# Patient Record
Sex: Male | Born: 1955 | Race: Black or African American | Hispanic: No | Marital: Married | Smoking: Current every day smoker
Health system: Southern US, Community
[De-identification: ages and names within clinical notes are randomized; demographics above are authoritative.]

## PROBLEM LIST (undated history)

## (undated) DIAGNOSIS — I10 Essential (primary) hypertension: Secondary | ICD-10-CM

## (undated) DIAGNOSIS — M542 Cervicalgia: Secondary | ICD-10-CM

## (undated) DIAGNOSIS — G8929 Other chronic pain: Secondary | ICD-10-CM

## (undated) DIAGNOSIS — E785 Hyperlipidemia, unspecified: Secondary | ICD-10-CM

## (undated) HISTORY — DX: Other chronic pain: G89.29

## (undated) HISTORY — DX: Essential (primary) hypertension: I10

## (undated) HISTORY — PX: CERVICAL SPINE SURGERY: SHX589

## (undated) HISTORY — DX: Cervicalgia: M54.2

## (undated) HISTORY — DX: Hyperlipidemia, unspecified: E78.5

---

## 2005-05-02 ENCOUNTER — Ambulatory Visit (HOSPITAL_COMMUNITY): Admission: RE | Admit: 2005-05-02 | Discharge: 2005-05-02 | Payer: Self-pay | Admitting: Neurological Surgery

## 2005-06-27 ENCOUNTER — Observation Stay (HOSPITAL_COMMUNITY): Admission: RE | Admit: 2005-06-27 | Discharge: 2005-06-28 | Payer: Self-pay | Admitting: Neurological Surgery

## 2005-08-28 ENCOUNTER — Encounter (HOSPITAL_COMMUNITY): Admission: RE | Admit: 2005-08-28 | Discharge: 2005-09-27 | Payer: Self-pay | Admitting: Neurological Surgery

## 2005-12-09 ENCOUNTER — Ambulatory Visit (HOSPITAL_COMMUNITY): Admission: RE | Admit: 2005-12-09 | Discharge: 2005-12-09 | Payer: Self-pay | Admitting: Neurological Surgery

## 2006-03-06 ENCOUNTER — Ambulatory Visit (HOSPITAL_COMMUNITY): Admission: RE | Admit: 2006-03-06 | Discharge: 2006-03-06 | Payer: Self-pay | Admitting: Neurological Surgery

## 2006-03-11 ENCOUNTER — Ambulatory Visit (HOSPITAL_COMMUNITY): Admission: RE | Admit: 2006-03-11 | Discharge: 2006-03-11 | Payer: Self-pay | Admitting: Neurological Surgery

## 2008-02-13 IMAGING — CT CT CERVICAL SPINE W/ CM
4 of 5 series · 15 of 33 positions shown, 18 images · IV contrast (omnipaque)
Comparison: none

CLINICAL DATA: Surgery of the cervical spine in June 2005.  Now with right arm pain and left leg pain.  
CERVICAL MYELOGRAM:
TECHNIQUE: The procedure and associated risks were reviewed with the patient.  Written as well as oral consent was obtained.  Under fluoroscopic guidance and aseptic technique, an L3-4 lumbar puncture was performed with a single as with a 22 gauge spinal needle.  Clear cerebrospinal fluid was noted.  8 cc of Omnipaque 300 was instilled.  Cervical myelogram was performed without difficulty.
TECHNIQUE: Multidetector CT imaging of the cervical spine was performed after intrathecal injection of contrast.  Multiplanar CT image reconstructions were also generated.   
The present examination had to be performed with the patient in a right side down position as the patient could not lie flat on his back.  This is felt to be a diagnostic study.

[Series 4: 2mm axial soft tissue · axial · 0.25mm/px · z∈[-254,-98]mm · 6 of 110 slices shown, 8 images]
[im 16/110  soft-tissue]
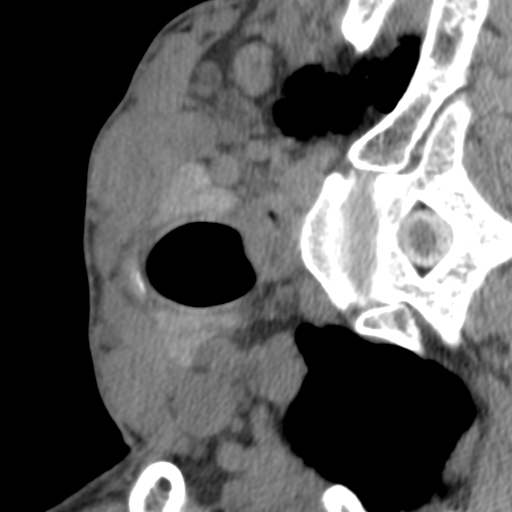
[im 16/110  bone]
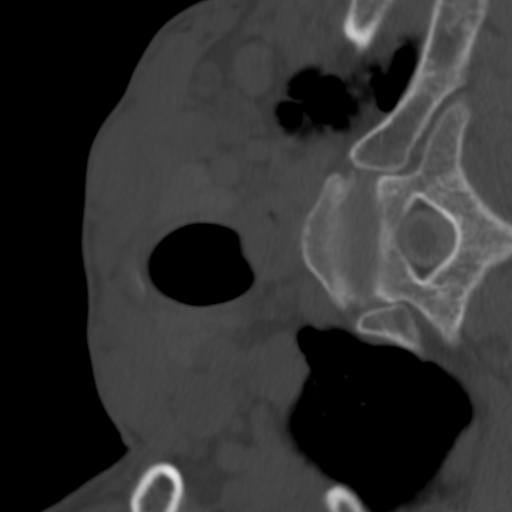
[im 32/110  bone]
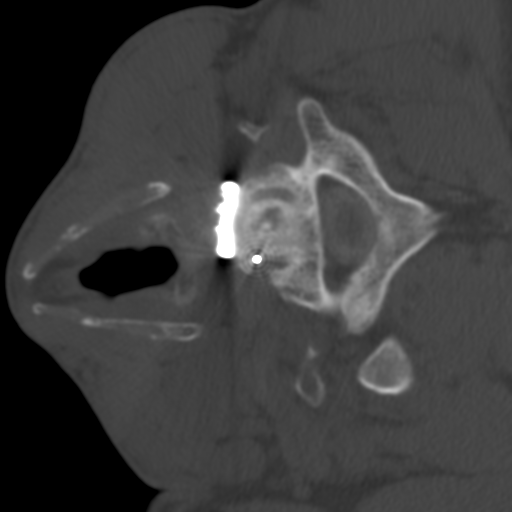
[im 47/110  bone]
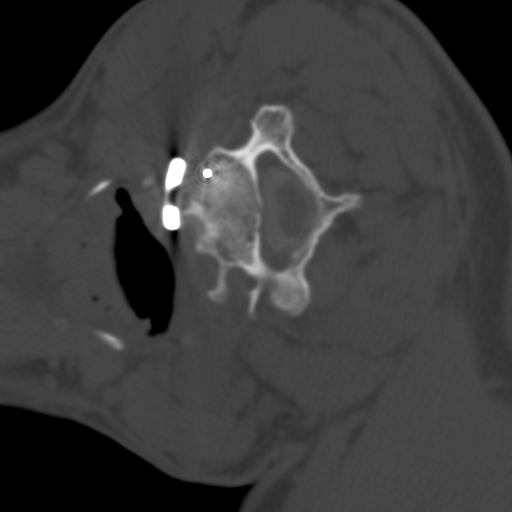
[im 63/110  bone]
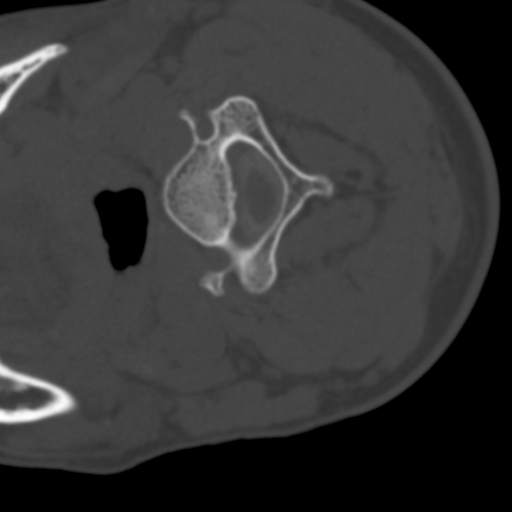
[im 78/110  soft-tissue]
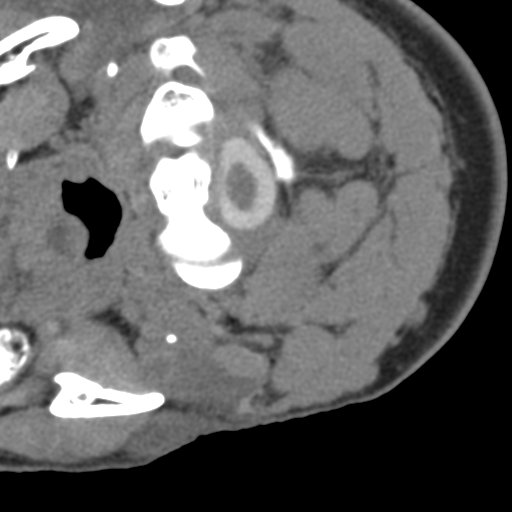
[im 78/110  bone]
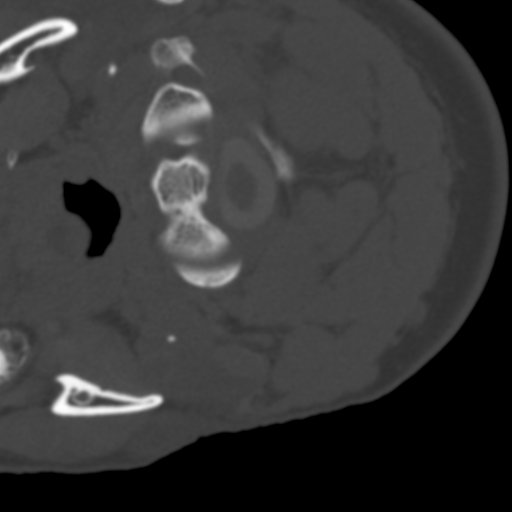
[im 94/110  bone]
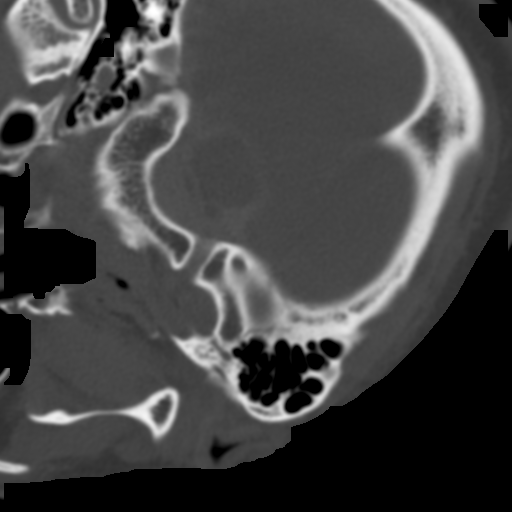

[Series 604: cor bone · coronal · 0.43mm/px · 1 of 52 slices shown]
[im 26/52  bone]
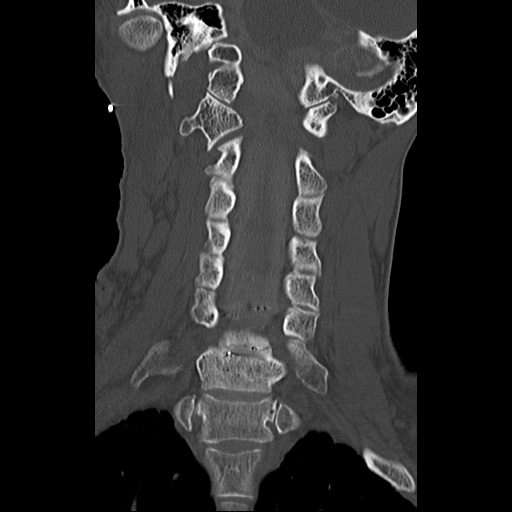

[Series 605: sag standard · sagittal · 0.43mm/px · 5 of 56 slices shown, 6 images]
[im 19/56  bone]
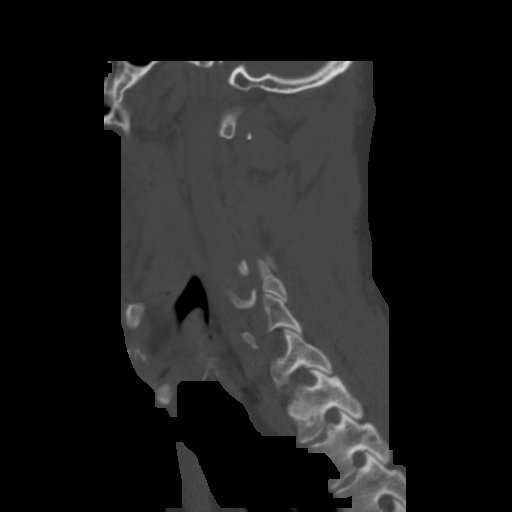
[im 23/56  bone]
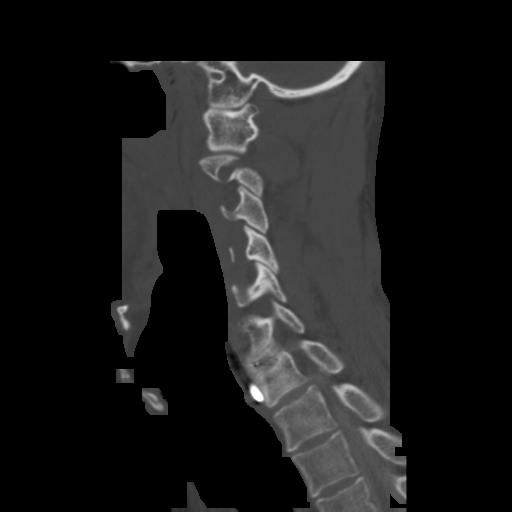
[im 28/56  soft-tissue]
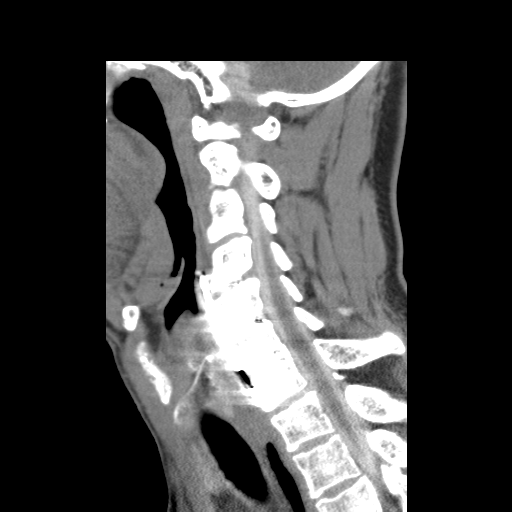
[im 28/56  bone]
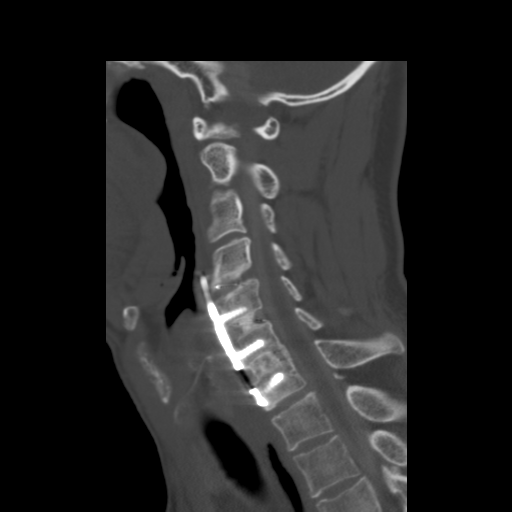
[im 33/56  bone]
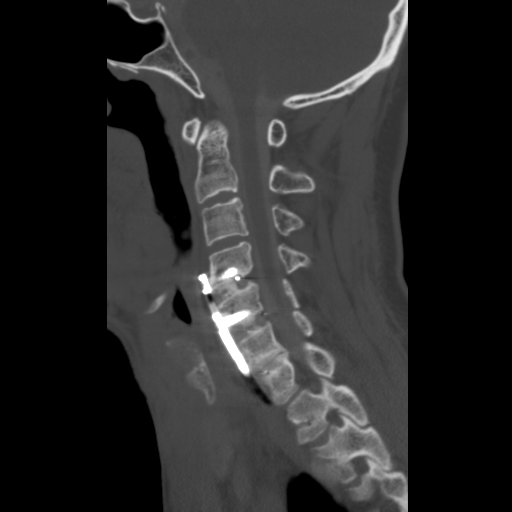
[im 37/56  bone]
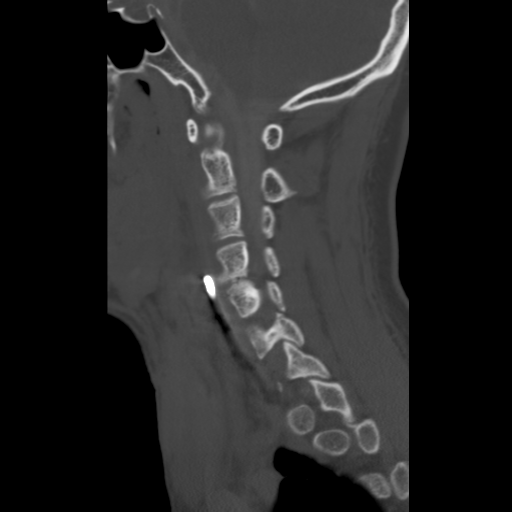

[Series 607: standard recon axials · axial · 0.43mm/px · z∈[-239,-180]mm · 3 of 77 slices shown]
[im 16/77  soft-tissue]
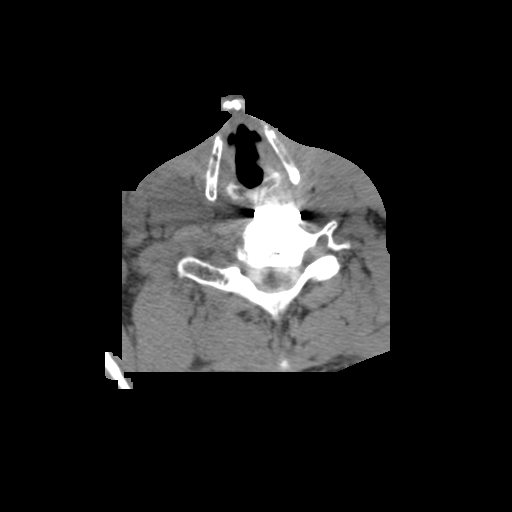
[im 31/77  soft-tissue]
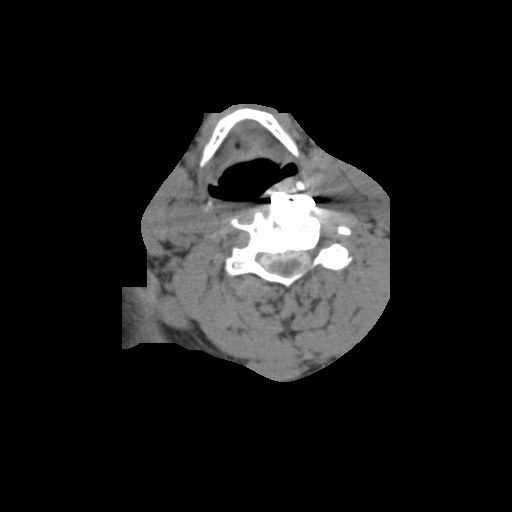
[im 46/77  soft-tissue]
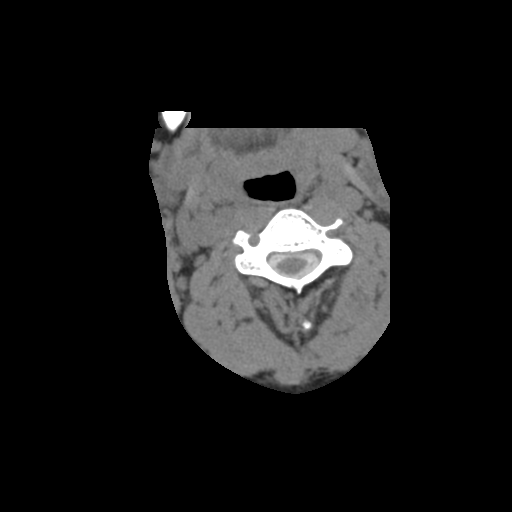

[15 of 33 positions shown; findings below may reference images not displayed]

FINDINGS: Status post fusion from C4 to C7.  With the patient?s neck in an extended position, there is circumferential narrowing of the canal most notable at the C3-4 level followed by the C2-3 level.
IMPRESSION: 1.  Status post fusion from C4 to C7. 
2.    Circumferential narrowing of the thecal sac at C3-4 followed by C2-3 level.
CT CERVICAL SPINE WITH CONTRAST (POST-MYELOGRAM):
FINDINGS: The cervicomedullary junction appears unremarkable.  Upon review the patient?s notes, it appears there was a prior MRI scan performed at an outside institution on 03/18/05 at which time a syrinx at C5-6 level inferiorly was noted.  This finding is not appreciated or evaluated on the present examination.  
Prior fusion from the C4 to C7 level.  With the patient?s head rotated slightly toward the right, there is impression upon the left posterior aspect of the pharynx at the piriform sinus level.  Significance is indeterminate.  iapical parenchymal changes without associated bony destruction which may represent scarring. Stability will need to be confirmed on follow-up.
C2-3:  No significant spinal stenosis or foraminal narrowing.
C3-4:  Mild bulge and mild spinal stenosis.
C4-5:  Minimal spur.  No significant spinal stenosis or foraminal narrowing.
C5-6:  No significant spinal stenosis or foraminal narrowing.
C6-7:  Small broad based bony spur with mild spinal stenosis.  Left uncinate hypertrophy with minimal left foraminal narrowing.
C7-T1:  Mild bulge.  Mild bilateral facet joint degenerative changes.  Minimal anterior slip of C7 upon T1.  No significant spinal stenosis.
IMPRESSION: 1.  Status post fusion C4 to C7 with small spur at the C6-7 level without significant spinal stenosis or foraminal narrowing.
2.  Congenitally narrowed spinal canal.  Addition of mild bulge at the C3-4 level causes mild spinal stenosis.  
3.  Biapical pleural thickening.  Recommend confirming stability on follow-up.
4.  Slight indentation upon the left posterior piriform sinus region with the patient?s head towards the right.  This is of questionable significance.

## 2010-01-28 ENCOUNTER — Encounter: Payer: Self-pay | Admitting: Neurological Surgery

## 2011-02-19 ENCOUNTER — Ambulatory Visit
Admission: RE | Admit: 2011-02-19 | Discharge: 2011-02-19 | Disposition: A | Discharge status: Home / Self Care | Payer: Self-pay | Source: Ambulatory Visit | Attending: Neurological Surgery | Admitting: Neurological Surgery

## 2011-02-19 ENCOUNTER — Other Ambulatory Visit: Payer: Self-pay | Admitting: Neurological Surgery

## 2011-02-19 DIAGNOSIS — M47812 Spondylosis without myelopathy or radiculopathy, cervical region: Secondary | ICD-10-CM

## 2017-03-11 ENCOUNTER — Encounter: Payer: Self-pay | Admitting: Physical Therapy

## 2017-03-11 ENCOUNTER — Ambulatory Visit: Payer: BLUE CROSS/BLUE SHIELD | Attending: Adult Health Nurse Practitioner | Admitting: Physical Therapy

## 2017-03-11 ENCOUNTER — Other Ambulatory Visit: Payer: Self-pay

## 2017-03-11 DIAGNOSIS — M5412 Radiculopathy, cervical region: Secondary | ICD-10-CM | POA: Insufficient documentation

## 2017-03-11 NOTE — Patient Instructions (Signed)
Flexibility: Neck Retraction    Pull head straight back, keeping eyes and jaw level. Repeat _5-10___ times per set. Do _1-2___ sets per session. Do __3__ sessions per day.  http://orth.exer.us/344   Copyright  VHI. All rights reserved.   [facility]  DRY NEEDLING CONSENT FORM   Trigger point dry needling is a physical therapy approach to treat Myofascial Pain and Dysfunction.  Dry Needling (DN) is a valuable and effective way to deactivate myofascial trigger points (muscle knots). It is skilled intervention that uses a thin filiform needle to penetrate the skin and stimulate underlying myofascial trigger points, muscular, and connective tissues for the management of neuromusculoskeletal pain and movement impairments.  A local twitch response (LTR) will be elicited.  This can sometimes feel like a deep ache in the muscle during the procedure. Multiple trigger points in multiple muscles can be treated during each treatment.  No medication of any kind is injected.   As with any medical treatment and procedure, there are possible adverse events.  While significant adverse events are uncommon, they do sometimes occur and must be considered prior to giving consent.  1. Dry needling often causes a "post needling soreness".  There can be an increase in pain from a couple of hours to 2-3 days, followed by an improvement in the overall pain state. 2. Any time a needle is used there is a risk of infection.  However, we are using new, sterile, and disposable needles; infections are extremely rare. 3. There is a possibility that you may bleed or bruise.  You may feel tired and some nausea following treatment. 4. There is a rare possibility of a pneumothorax (air in the chest cavity). 5. Allergic reaction to nickel in the stainless steel needle. 6. If a nerve is touched, it may cause paresthesia (a prickling/shock sensation) which is usually brief, but may continue for a  couple of days.  Following treatment stay hydrated.  Continue regular activities but not too vigorous initially after treatment for 24-48 hours. You may apply heat to sore muscles.  Dry Needling is best when combined with other physical therapy interventions such as strengthening, stretching and other therapeutic modalities.     PLEASE ANSWER THE FOLLOWING QUESTIONS:  Do you have a lack of sensation?   Y/N  Do you have a phobia or fear of needles  Y/N  Are you pregnant?    Y/N If yes:  How many weeks? _____  Do you have any implanted devices?  Y/N If yes:  Pacemaker/Spinal Cord         Stimulator/Deep Brain         Stimulator/Insulin          Pump/Other: ____________ Do you have any implants?   Y/N If yes:      Do you take any blood thinners?   Y/N If yes: Coumadin          (Warfarin)/Other:  Do you have a bleeding disorder?   Y/N If yes: What kind:   Do you take any immunosuppressants?  Y/N If yes:   What kind:   Do you take anti-inflammatories?   Y/N If yes: What kind:  Have you ever been diagnosed with Scoliosis? Y/N  Have you had back surgery?    Y/N If yes:         Laminectomy/Fusion/Other:   I have read, or had read to me, the above.  I have had the opportunity to ask any questions.  All of  my questions have been answered to my satisfaction and I understand the risks involved with dry needling.  [facility] and [facility], including dry needling, of any and all of my involved and affected muscles.   Solon PalmJulie Nathyn Luiz, PT 03/11/17 9:02 AM [facility] [address] [place], [place], [address] Phone: [phone]   Fax:  [phone]
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
## 2017-03-18 ENCOUNTER — Ambulatory Visit: Payer: BLUE CROSS/BLUE SHIELD | Admitting: Physical Therapy

## 2017-03-18 DIAGNOSIS — M5412 Radiculopathy, cervical region: Secondary | ICD-10-CM

## 2017-03-18 NOTE — Therapy (Signed)
[facility] [place], [place], [address] Phone: [phone]   Fax:  [phone]  Physical Therapy Treatment  Patient Details  Name: Jermaine Spencer MRN: [id number] Date of Birth: [birth year] Referring Provider: Lars Mage NP   Encounter Date: 03/18/2017  PT End of Session - 03/18/17 0810    Visit Number  2    Number of Visits  12    Date for PT Re-Evaluation  04/22/17    PT Start Time  0815    PT Stop Time  0913    PT Time Calculation (min)  58 min    Activity Tolerance  Patient limited by pain    Behavior During Therapy  [facility] for tasks assessed/performed       Past Medical History:  Diagnosis Date  . Hypertension     No past surgical history on file.  There were no vitals filed for this visit.  Subjective Assessment - 03/18/17 0817    Subjective  Patient reports no improvements with chin tucks and extension and says it may make it worse. He was only able to walk a half a mile the other day and the his neck and arm were just throbbing. Patient said the meloxicam initially helped but now he gets no relief from it.     Patient Stated Goals  get back to PLOF    Currently in Pain?  Yes    Pain Score  8  8.5    Pain Location  Neck    Pain Orientation  Mid    Pain Descriptors / Indicators  Sharp;Throbbing    Pain Type  Chronic pain                      [facility] Adult PT Treatment/Exercise - 03/18/17 0001      Neck Exercises: Seated   Neck Retraction  5 reps;10 secs      Neck Exercises: Supine   Cervical Isometrics  Extension;Flexion;Right lateral flexion;Left lateral flexion;5 secs;5 reps    Neck Retraction  5 reps;5 secs;Limitations      Modalities   Modalities  Cryotherapy;Moist Heat      Moist Heat Therapy   Number Minutes Moist Heat  15 Minutes    Moist Heat Location  Cervical      Electrical Stimulation   Electrical Stimulation Location  L neck and UT premod 80-150 Hz x 15 min in  sitting    Electrical Stimulation Goals  Pain      Manual Therapy   Manual Therapy  Manual Traction;Myofascial release    Myofascial Release  OA release    Manual Traction  2 bouts x 30 sec             PT Education - 03/18/17 1521    Education provided  Yes    Education Details  HEP    Person(s) Educated  Patient    Methods  Explanation;Demonstration;Verbal cues;Handout    Comprehension  Verbalized understanding;Returned demonstration          PT Long Term Goals - 03/11/17 1103      PT LONG TERM GOAL #1   Title  I with HEP    Time  6    Period  Weeks    Status  New    Target Date  04/22/17      PT LONG TERM GOAL #2   Title  Patient to report no numbness into LUE with ADLS    Time  6    Period  Weeks    Status  New    Target Date  04/22/17      PT LONG TERM GOAL #3   Title  Patient to demonstrate functional ROM in the neck to perform normal ADLs with pain 3/10 or less.    Time  6    Period  Weeks    Status  New    Target Date  04/22/17      PT LONG TERM GOAL #4   Title  Patient to demo improved grip strength to 40# or more bil.    Time  6    Period  Weeks    Status  New    Target Date  04/22/17            Plan - 03/18/17 1523    Clinical Impression Statement  Patient reports no improvements today. He felt cervical retraction with ext made sx worse so we stuck with just retraction. This also felt better in supine vs. sitting. No significant relief with manual traction today. He did well with isometrics. All exercises are performed very slowly to avoid pain. No goals met as only second visit.    Rehab Potential  Good    PT Frequency  2x / week    PT Duration  6 weeks    PT Treatment/Interventions  ADLs/Self Care Home Management;Cryotherapy;Electrical Stimulation;Moist Heat;Ultrasound;Therapeutic exercise;Manual techniques;Patient/family education;Neuromuscular re-education;Dry needling    PT Next Visit Plan  Continue with isometrics in supine; work  grip strength; Assess ULTT as tolerated;  DN to suboccipital/cspine L, gentle manual traction; manual STW,  estim for pain.    PT Home Exercise Plan  chin tuck supine, isometrics supine       Patient will benefit from skilled therapeutic intervention in order to improve the following deficits and impairments:  Pain, Decreased range of motion, Decreased strength, Impaired UE functional use  Visit Diagnosis: Radiculopathy, cervical region     Problem List There are no active problems to display for this patient.   Almyra Free Mehreen Azizi PT 03/18/2017, 3:29 PM  [facility] [place], [place], [address] Phone: [phone]   Fax:  [phone]  Name: Jermaine Spencer MRN: [id number] Date of Birth: 18-Jul-1955

## 2017-03-18 NOTE — Patient Instructions (Signed)
   Solon PalmJulie Ilija Maxim, PT 03/18/17 8:52 AM [facility] [address] [place], [place], [address] Phone: [phone]   Fax:  [phone]

## 2017-03-25 ENCOUNTER — Ambulatory Visit: Payer: BLUE CROSS/BLUE SHIELD | Admitting: Physical Therapy

## 2017-03-25 DIAGNOSIS — M5412 Radiculopathy, cervical region: Secondary | ICD-10-CM

## 2017-03-25 NOTE — Therapy (Signed)
[facility] [address] [place], [place], [address] Phone: [phone]   Fax:  [phone]  Physical Therapy Treatment  Patient Details  Name: Jermaine Spencer MRN: [id number] Date of Birth: [birth year] Referring Provider: Sharon Seller NP   Encounter Date: 03/25/2017  PT End of Session - 03/25/17 0816    Visit Number  3    Number of Visits  12    Date for PT Re-Evaluation  04/22/17    PT Start Time  0815    PT Stop Time  0909    PT Time Calculation (min)  54 min    Activity Tolerance  Patient tolerated treatment well;Patient limited by pain    Behavior During Therapy  [facility] for tasks assessed/performed       Past Medical History:  Diagnosis Date  . Hypertension     No past surgical history on file.  There were no vitals filed for this visit.  Subjective Assessment - 03/25/17 0816    Subjective  Patient states his neck isn't hurting as bad since doing the exercises but continues to have numbness and tingling in LUE and also he gets pain when riding in a car with any jolt.    Pertinent History  HTN, cervical fusion C4-7    Diagnostic tests  xray - negative    Currently in Pain?  Yes    Pain Score  6     Pain Location  Neck    Pain Orientation  Mid    Pain Descriptors / Indicators  Sharp;Throbbing    Pain Type  Chronic pain    Pain Radiating Towards  into LUE all fingers    Pain Onset  More than a month ago    Pain Frequency  Constant    Aggravating Factors   looking down    Pain Relieving Factors  moist heat                      [facility] Adult PT Treatment/Exercise - 03/25/17 0001      Neck Exercises: Seated   Shoulder ABduction  Both;20 reps horizontal ABD red band    Other Seated Exercise  nerve glide x 10 increased pain in neck; arm still numb      Neck Exercises: Supine   Neck Retraction  5 reps;5 secs;Limitations    Other Supine Exercise  nerve glide with wrist flex/ext x 10      Modalities    Modalities  Electrical Stimulation;Moist Heat;Ultrasound      Moist Heat Therapy   Number Minutes Moist Heat  15 Minutes    Moist Heat Location  Cervical      Electrical Stimulation   Electrical Stimulation Location  L neck and UT premod 80-150 Hz x 15 min in sitting    Electrical Stimulation Goals  Pain      Ultrasound   Ultrasound Location  L UT and cspine    Ultrasound Parameters  1.5 w/cm2 1 mhz cont x 10 min    Ultrasound Goals  Pain                  PT Long Term Goals - 03/25/17 0905      PT LONG TERM GOAL #1   Title  I with HEP    Time  6    Period  Weeks    Status  On-going      PT LONG TERM GOAL #2   Title  Patient to report  no numbness into LUE with ADLS    Time  6    Period  Weeks    Status  On-going      PT LONG TERM GOAL #3   Title  Patient to demonstrate functional ROM in the neck to perform normal ADLs with pain 3/10 or less.    Time  6    Period  Weeks    Status  On-going      PT LONG TERM GOAL #4   Title  Patient to demo improved grip strength to 40# or more bil.    Time  6    Period  Weeks    Status  On-going            Plan - 03/25/17 [id number]    Clinical Impression Statement  Patient tolerated PT fairly well today. Nerve glides continue to aggravate neck. He couldn't tolerate manual therapy in L UT today so tried US to calm muscles    Rehab Potential  Good    PT Frequency  2x / week    PT Duration  6 weeks    PT Treatment/Interventions  ADLs/Self Care Home Management;Cryotherapy;Electrical Stimulation;Moist Heat;Ultrasound;Therapeutic exercise;Manual techniques;Patient/family education;Neuromuscular re-education;Dry needling    PT Next Visit Plan  Continue with isometrics in supine; work grip strength; ULTT as tolerated;  DN to suboccipital/cspine L, gentle manual traction; manual STW,  estim for pain.    PT Home Exercise Plan  chin tuck supine, isometrics supine       Patient will benefit from skilled therapeutic intervention  in order to improve the following deficits and impairments:  Pain, Decreased range of motion, Decreased strength, Impaired UE functional use  Visit Diagnosis: Radiculopathy, cervical region     Problem List There are no active problems to display for this patient.   Raynelle FanningJulie Maryclare Nydam PT 03/25/2017, 9:08 AM  [facility] [address] [place], [place], [address] Phone: [phone]   Fax:  [phone]  Name: Jermaine Spencer MRN: [id number] Date of Birth: May 15, 1955

## 2017-04-01 ENCOUNTER — Ambulatory Visit: Payer: BLUE CROSS/BLUE SHIELD | Admitting: Physical Therapy

## 2017-04-01 DIAGNOSIS — M5412 Radiculopathy, cervical region: Secondary | ICD-10-CM | POA: Diagnosis not present

## 2017-04-01 NOTE — Therapy (Signed)
[facility] [place], [place], [address] Phone: [phone]   Fax:  [phone]  Physical Therapy Treatment  Patient Details  Name: Jermaine Spencer MRN: [id number] Date of Birth: Jun 15, 1955 Referring Provider: Lars Mage NP   Encounter Date: 04/01/2017  PT End of Session - 04/01/17 0815    Visit Number  4    Number of Visits  12    Date for PT Re-Evaluation  04/22/17    Authorization Type  FOTO every 5th visit    PT Start Time  0815    PT Stop Time  0911    PT Time Calculation (min)  56 min    Activity Tolerance  Patient limited by pain    Behavior During Therapy  [facility] for tasks assessed/performed       Past Medical History:  Diagnosis Date  . Hypertension     No past surgical history on file.  There were no vitals filed for this visit.  Subjective Assessment - 04/01/17 0815    Subjective  Patient was awakened Saturday night and sat up up without supporting his neck. He heard a "pop" and had immediate pain into the LUE an LLE. Since that time he has had intermittent numbness into LLE. Pt also reports tingling into LUE with temperature changes specifically into the L middle finger. He is compliant with HEP.    Pertinent History  HTN, cervical fusion C4-7    Patient Stated Goals  get back to PLOF    Currently in Pain?  Yes    Pain Score  7     Pain Location  Neck    Pain Orientation  Mid    Pain Descriptors / Indicators  Sharp;Throbbing    Pain Type  Chronic pain    Pain Onset  More than a month ago    Pain Frequency  Constant    Aggravating Factors   looking down    Pain Relieving Factors  moist heat, isometrics         [facility] PT Assessment - 04/01/17 0001      AROM   Overall AROM Comments  Grip R 65#, L 25#    AROM Assessment Site  Cervical    Cervical - Right Side Bend  9    Cervical - Left Side Bend  15    Cervical - Right Rotation  42    Cervical - Left Rotation  24            No  data recorded       [facility] Adult PT Treatment/Exercise - 04/01/17 0001      Self-Care   Self-Care  Other Self-Care Comments    Other Self-Care Comments   Discussed current symptoms and POC with patient       Neck Exercises: Seated   Other Seated Exercise  squeezing red putty x 5 min       Neck Exercises: Supine   Neck Retraction  5 reps;5 secs;Limitations    Other Supine Exercise  decompression exerrcises head/shoulder/legs 5 sec x 5 each to tolerance      Modalities   Modalities  Electrical Stimulation;Moist Heat      Moist Heat Therapy   Number Minutes Moist Heat  15 Minutes    Moist Heat Location  Cervical      Electrical Stimulation   Electrical Stimulation Location  L neck and UT premod 80-150 Hz x 15 min in sitting    Electrical Stimulation Goals  Pain             PT Education - 04/01/17 1406    Education provided  Yes    Education Details  HEP    Person(s) Educated  Patient    Methods  Explanation;Demonstration;Handout;Verbal cues    Comprehension  Verbalized understanding;Returned demonstration          PT Long Term Goals - 04/01/17 1407      PT LONG TERM GOAL #1   Title  I with HEP    Time  6    Period  Weeks    Status  Achieved      PT LONG TERM GOAL #2   Title  Patient to report no numbness into LUE with ADLS    Time  6    Period  Weeks    Status  Not Met      PT LONG TERM GOAL #3   Title  Patient to demonstrate functional ROM in the neck to perform normal ADLs with pain 3/10 or less.    Time  6    Period  Weeks    Status  Not Met      PT LONG TERM GOAL #4   Title  Patient to demo improved grip strength to 40# or more bil.    Time  6    Period  Weeks    Status  Not Met            Plan - 04/01/17 0901    Clinical Impression Statement  Patient was awakened Saturday night and sat up up without supporting his neck. He heard a "pop" and had immediate pain into the LUE an LLE. Since that time he has had intermittent numbness into  LLE. Pt also reports tingling into LUE with temperature changes specifically into the L middle finger. He is compliant with HEP and reports that supine isometrics help somewhat. Patient has had no change in radicular symptoms and they seem to be worsening. He has had no improvement in ROM and left grip strength was weaker today than at eval.  I've advised pt to f/u earlier with MD for further testing.     Rehab Potential  Good    PT Frequency  2x / week    PT Duration  6 weeks    PT Treatment/Interventions  ADLs/Self Care Home Management;Cryotherapy;Electrical Stimulation;Moist Heat;Ultrasound;Therapeutic exercise;Manual techniques;Patient/family education;Neuromuscular re-education;Dry needling    PT Next Visit Plan  Continue with isometrics in supine; work grip strength; ULTT as tolerated;  DN to suboccipital/cspine L, gentle manual traction; manual STW,  estim for pain.    PT Home Exercise Plan  chin tuck supine, isometrics supine, decompression exercises for OP    Consulted and Agree with Plan of Care  Patient       Patient will benefit from skilled therapeutic intervention in order to improve the following deficits and impairments:  Pain, Decreased range of motion, Decreased strength, Impaired UE functional use  Visit Diagnosis: Radiculopathy, cervical region     Problem List There are no active problems to display for this patient.   Madelyn Flavors PT 04/01/2017, 2:14 PM  [facility] [address] [place], [place], [address] Phone: [phone]   Fax:  [phone]  Name: Jermaine Spencer MRN: [id number] Date of Birth: 01/30/1955

## 2017-04-01 NOTE — Patient Instructions (Addendum)
RE-ALIGNMENT ROUTINE EXERCISES-OSTEOPROROSIS BASIC FOR POSTURAL CORRECTION   RE-ALIGNMENT Tips BENEFITS: 1.It helps to re-align the curves of the back and improve standing posture. 2.It allows the back muscles to rest and strengthen in preparation for more activity. FREQUENCY: Daily, even after weeks, months and years of more advanced exercises. START: 1.All exercises start in the same position: lying on the back, arms resting on the supporting surface, palms up and slightly away from the body, backs of hands down, knees bent, feet flat. 2.The head, neck, arms, and legs are supported according to specific instructions of your therapist. Copyright  V[employer]. All rights reserved.    1. Decompression Exercise: Basic.   Takes compression off the vertebral bodies; increases tolerance for lying on the back; helps relieve back pain   Lie on back on firm surface, knees bent, feet flat, arms turned up, out to sides (~35 degrees). Head neck and arms supported as necessary. Time _5-15__ minutes. Surface: floor     2. Shoulder Press  Strengthens upper back extensors and scapular retractors.   Press both shoulders down. Hold _2-3__ seconds. Repeat _3-5__ times. Surface: floor        3. Head Press With Chin Tuck  Strengthens neck extensors   Tuck chin SLIGHTLY toward chest, keep mouth closed. Feel weight on back of head. Increase weight by pressing head down. Hold _2-3__ seconds. Relax. Repeat 3-5___ times. Surface: floor     4. Leg Lengthener: stretches quadratus lumborum and hip flexors.  Strengthens quads and ankle dorsiflexors.  Leg Lengthener: Full    Straighten one leg. Pull toes AND forefoot toward knee, extend heel. Lengthen leg by pulling pelvis away from ribs. Hold __5_ seconds. Relax. Repeat 1 time. Re-bend knee. Do other leg. Each leg __5_ times. Surface: floor    Leg Lengthener / Leg Press Combo: Single Leg    Straighten one leg down to floor. Pull toes AND forefoot  toward knee; extend heel. Lengthen leg by pulling pelvis away from ribs. Press leg down. DO NOT BEND KNEE. Hold _5__ seconds. Relax leg. Repeat exercise 1 time. Relax leg. Re-bend knee. Repeat with other leg. Do 5 times  Solon PalmJulie Burel Kahre, PT 04/01/17 8:57 AM [facility] [address] [place], [place], [id number] Phone: [phone]   Fax:  [phone]

## 2017-04-08 ENCOUNTER — Ambulatory Visit: Payer: BLUE CROSS/BLUE SHIELD | Attending: Adult Health Nurse Practitioner | Admitting: Physical Therapy

## 2017-04-08 DIAGNOSIS — M5412 Radiculopathy, cervical region: Secondary | ICD-10-CM | POA: Insufficient documentation

## 2017-04-08 NOTE — Therapy (Signed)
[facility] [place], [place], [address] Phone: [phone]   Fax:  [phone]  Physical Therapy Treatment  Patient Details  Name: Jermaine Spencer MRN: [id number] Date of Birth: 30-Jan-1955 Referring Provider: Lars Mage NP   Encounter Date: 04/08/2017  PT End of Session - 04/08/17 5681    Visit Number  5    Number of Visits  12    Date for PT Re-Evaluation  04/22/17    Authorization Type  FOTO every 5th visit    PT Start Time  0815    PT Stop Time  0904    PT Time Calculation (min)  49 min    Activity Tolerance  Patient limited by pain    Behavior During Therapy  [facility] for tasks assessed/performed       Past Medical History:  Diagnosis Date  . Hypertension     No past surgical history on file.  There were no vitals filed for this visit.  Subjective Assessment - 04/08/17 0823    Subjective  Patient stated he is feeling "alright". Patient stated he was unable to make an earlier appointment for a follow up with his MD but he will stop at doctor's office tomorrow to try again. Patient stated he may need to go back to work secondary to financial needs.     Pertinent History  HTN, cervical fusion C4-7    Diagnostic tests  xray - negative    Patient Stated Goals  get back to PLOF    Currently in Pain?  Yes    Pain Score  7     Pain Location  Neck    Pain Orientation  Left;Mid    Pain Descriptors / Indicators  Sharp    Pain Type  Chronic pain    Pain Onset  More than a month ago         [facility] PT Assessment - 04/08/17 0001      Assessment   Medical Diagnosis  Cervical DDD    Next MD Visit  April      Precautions   Precautions  Cervical    Precaution Comments  previous fusion C4-7      Observation/Other Assessments   Focus on Therapeutic Outcomes (FOTO)   55% limited                   [facility] Adult PT Treatment/Exercise - 04/08/17 0001      Neck Exercises: Supine   Neck Retraction  5  reps;5 secs    Cervical Rotation  Other (comment) attempted; increase of pain    Other Supine Exercise  decompression exerrcises head/shoulder/legs 5 sec x 5 each to tolerance      Modalities   Modalities  Electrical Stimulation;Moist Heat      Moist Heat Therapy   Number Minutes Moist Heat  15 Minutes    Moist Heat Location  Cervical      Electrical Stimulation   Electrical Stimulation Location  L neck and UT premod 80-150 Hz x 15 min in sitting    Electrical Stimulation Goals  Pain      Manual Therapy   Manual Therapy  Manual Traction;Myofascial release;Soft tissue mobilization    Soft tissue mobilization  STW/M to suboccipitals, cervical paraspinals, and L UT to decrease pain; attempted UT stretch however noted with pain and increased UE sx    Myofascial Release  suboccipital release    Manual Traction  4x 15 seconds  PT Long Term Goals - 04/01/17 1407      PT LONG TERM GOAL #1   Title  I with HEP    Time  6    Period  Weeks    Status  Achieved      PT LONG TERM GOAL #2   Title  Patient to report no numbness into LUE with ADLS    Time  6    Period  Weeks    Status  Not Met      PT LONG TERM GOAL #3   Title  Patient to demonstrate functional ROM in the neck to perform normal ADLs with pain 3/10 or less.    Time  6    Period  Weeks    Status  Not Met      PT LONG TERM GOAL #4   Title  Patient to demo improved grip strength to 40# or more bil.    Time  6    Period  Weeks    Status  Not Met            Plan - 04/08/17 0914    Clinical Impression Statement  Patient was able to complete ther-ex but reported pain & neurological sx with supine c/s rotation. Patient reported increased tenderness along superior angle of the L scapula, L cervical paraspinals, L scalenes however patient stated STW was tolerable after pressure was decreased. Patient was unable to tolerate passive cervical rotation secondary to increased pain and UE symptoms;  c/s rotation terminated. Patient educated to continue HEP; patient reported understanding. Normal response to modalities upon removal. FOTO limitation: 55% limited.    Clinical Presentation  Evolving    Clinical Decision Making  Low    Rehab Potential  Good    PT Frequency  2x / week    PT Duration  6 weeks    PT Next Visit Plan  Continue with isometrics in supine; work grip strength; ULTT as tolerated;  DN to suboccipital/cspine L, gentle manual traction; manual STW,  estim for pain.    PT Home Exercise Plan  chin tuck supine, isometrics supine, decompression exercises for OP    Consulted and Agree with Plan of Care  Patient       Patient will benefit from skilled therapeutic intervention in order to improve the following deficits and impairments:  Pain, Decreased range of motion, Decreased strength, Impaired UE functional use  Visit Diagnosis: Radiculopathy, cervical region     Problem List There are no active problems to display for this patient.  Jermaine Spencer, PT, DPT 04/08/2017, 9:28 AM  [facility] [address] [place], [place], [address] Phone: [phone]   Fax:  [phone]  Name: Jermaine Spencer MRN: [id number] Date of Birth: 22-Feb-1955

## 2017-04-15 ENCOUNTER — Ambulatory Visit: Payer: BLUE CROSS/BLUE SHIELD | Admitting: Physical Therapy

## 2017-04-15 DIAGNOSIS — M5412 Radiculopathy, cervical region: Secondary | ICD-10-CM | POA: Diagnosis not present

## 2017-04-15 NOTE — Therapy (Signed)
[facility] [place], [place], [address] Phone: [phone]   Fax:  [phone]  Physical Therapy Evaluation  Patient Details  Name: Jermaine Spencer MRN: [id number] Date of Birth: 07-12-1955 Referring Provider: Lars Mage NP   Encounter Date: 04/15/2017  PT End of Session - 04/15/17 0811    Visit Number  6    Number of Visits  12    Date for PT Re-Evaluation  04/22/17    Authorization Type  FOTO every 5th visit    PT Start Time  0815    PT Stop Time  0909    PT Time Calculation (min)  54 min    Activity Tolerance  Patient limited by pain    Behavior During Therapy  [facility] for tasks assessed/performed       Past Medical History:  Diagnosis Date  . Hypertension     No past surgical history on file.  There were no vitals filed for this visit.   Subjective Assessment - 04/15/17 0821    Subjective  Patient reports he is scheduled for an MRI on 04/17/17.  He is back to work and states that if he lifts anything over 10# he feels it up into his neck.     Pertinent History  HTN, cervical fusion C4-7    Patient Stated Goals  get back to PLOF    Currently in Pain?  Yes    Pain Score  7     Pain Location  Neck    Pain Orientation  Mid;Left    Pain Descriptors / Indicators  Sharp    Pain Type  Chronic pain    Pain Onset  More than a month ago    Pain Frequency  Constant         [facility] PT Assessment - 04/15/17 0001      AROM   Overall AROM Comments  Grip R 85#, L 20#  (Pended)                 Objective measurements completed on examination: See above findings.      [place] Adult PT Treatment/Exercise - 04/15/17 0001      Neck Exercises: Seated   Shoulder ABduction  10 reps horizontal; increases sx    Other Seated Exercise  robber with red band x 30      Neck Exercises: Supine   Neck Retraction  5 secs;20 reps 1 set prior to band ex; 1 set prior to supine to sit    Shoulder ABduction  Left;5  reps increased neck sx    Upper Extremity D2  Extension increased sx      Modalities   Modalities  Electrical Stimulation;Moist Heat      Moist Heat Therapy   Number Minutes Moist Heat  15 Minutes    Moist Heat Location  Cervical      Electrical Stimulation   Electrical Stimulation Location  L neck and UT premod 80-150 Hz x 15 min in sitting    Electrical Stimulation Goals  Pain      Manual Therapy   Manual Therapy  Manual Traction    Manual Traction  4 x 30 seconds centralizes sx to above elbow from hand                  PT Long Term Goals - 04/01/17 1407      PT LONG TERM GOAL #1   Title  I with HEP    Time  6    Period  Weeks    Status  Achieved      PT LONG TERM GOAL #2   Title  Patient to report no numbness into LUE with ADLS    Time  6    Period  Weeks    Status  Not Met      PT LONG TERM GOAL #3   Title  Patient to demonstrate functional ROM in the neck to perform normal ADLs with pain 3/10 or less.    Time  6    Period  Weeks    Status  Not Met      PT LONG TERM GOAL #4   Title  Patient to demo improved grip strength to 40# or more bil.    Time  6    Period  Weeks    Status  Not Met             Plan - 04/15/17 1240    Clinical Impression Statement  Patient presents today with same c/o. He has returned to work which aggravates his sx with heavy lifting and looking down. He is scheduled for an MRI on 04/17/17. He did not tolerate most exercises today. He gets some centralization of sx to above the elbow with light manual traction, however decentralization occurs upon release of traction. Grip strength on the L continues to decrease while R increases.    PT Treatment/Interventions  ADLs/Self Care Home Management;Cryotherapy;Electrical Stimulation;Moist Heat;Ultrasound;Therapeutic exercise;Manual techniques;Patient/family education;Neuromuscular re-education;Dry needling    PT Next Visit Plan  Continue with isometrics in supine; work grip  strength; ULTT as tolerated;    PT Home Exercise Plan  chin tuck supine, isometrics supine, decompression exercises for OP       Patient will benefit from skilled therapeutic intervention in order to improve the following deficits and impairments:  Pain, Decreased range of motion, Decreased strength, Impaired UE functional use  Visit Diagnosis: Radiculopathy, cervical region     Problem List There are no active problems to display for this patient.   Madelyn Flavors PT 04/15/2017, 12:44 PM  [facility] [address] [place], [place], [address] Phone: [phone]   Fax:  [phone]  Name: Jermaine Spencer MRN: [id number] Date of Birth: [birth year]

## 2017-04-22 ENCOUNTER — Ambulatory Visit: Payer: BLUE CROSS/BLUE SHIELD | Admitting: Physical Therapy

## 2017-04-22 DIAGNOSIS — M5412 Radiculopathy, cervical region: Secondary | ICD-10-CM | POA: Diagnosis not present

## 2017-04-22 NOTE — Therapy (Addendum)
[facility] [place], [place], [address] Phone: [phone]   Fax:  [phone]  Physical Therapy Treatment PHYSICAL THERAPY DISCHARGE SUMMARY  Visits from Start of Care: 7  Current functional level related to goals / functional outcomes: See below   Remaining deficits: See goals   Education / Equipment: HEP  Plan: Patient agrees to discharge.  Patient goals were not met. Patient is being discharged due to lack of progress.  ?????  [name], PT, DPT'10/21/19    Patient Details  Name: Jermaine Spencer MRN: [id number] Date of Birth: 1955-04-02 Referring Provider: Lars Mage NP   Encounter Date: 04/22/2017  PT End of Session - 04/22/17 0807    Visit Number  7    Number of Visits  12    Date for PT Re-Evaluation  04/22/17    Authorization Type  FOTO every 5th visit    PT Start Time  0815    PT Stop Time  0906    PT Time Calculation (min)  51 min    Activity Tolerance  Patient limited by pain    Behavior During Therapy  [facility] for tasks assessed/performed       Past Medical History:  Diagnosis Date  . Hypertension     No past surgical history on file.  There were no vitals filed for this visit.  Subjective Assessment - 04/22/17 0816    Subjective  Patient had his MRI on 04/17/17. During the MRI his LUE went numb. Patient says estim gives him 3-4 hours of relief.    Patient Stated Goals  get back to PLOF    Currently in Pain?  Yes    Pain Score  7     Pain Location  Neck    Pain Orientation  Mid;Left    Pain Descriptors / Indicators  [facility] Adult PT Treatment/Exercise - 04/22/17 0001      Self-Care   Self-Care  Other Self-Care Comments    Other Self-Care Comments   Discussed current POC and lack of progress      Neck Exercises: Standing   Neck Retraction  10 reps;5 secs into ball    Wall Wash  attempted but sx increase into LUE    Other  Standing Exercises  rows and shoulder ext x 10 each pink XTS    Other Standing Exercises  cerv retraction with rotation 2x10 into ball      Modalities   Modalities  Electrical Stimulation;Moist Heat      Moist Heat Therapy   Number Minutes Moist Heat  15 Minutes    Moist Heat Location  Cervical      Electrical Stimulation   Electrical Stimulation Location  L neck and UT premod 80-150 Hz x 15 min in sitting    Electrical Stimulation Goals  Pain      Manual Therapy   Soft tissue mobilization  attempted STW to L UT/levator scapula but not tolerated                  PT Long Term Goals - 04/01/17 1407      PT LONG TERM GOAL #1   Title  I with HEP    Time  6    Period  Weeks    Status  Achieved      PT LONG TERM GOAL #2  Title  Patient to report no numbness into LUE with ADLS    Time  6    Period  Weeks    Status  Not Met      PT LONG TERM GOAL #3   Title  Patient to demonstrate functional ROM in the neck to perform normal ADLs with pain 3/10 or less.    Time  6    Period  Weeks    Status  Not Met      PT LONG TERM GOAL #4   Title  Patient to demo improved grip strength to 40# or more bil.    Time  6    Period  Weeks    Status  Not Met            Plan - 04/22/17 1505    Clinical Impression Statement  Patient presents today with same c/o of pain in his neck and LUE. He was able to tolerate some exercises for a short time, but others increased his sx. He could not tolerate manual therapy either. PT discussed lack of progress and advised putting pt on hold until results of MRI. Patient agreed. Patient is always willing to try exercises and is compliant with those that do not increase sx.,    PT Treatment/Interventions  ADLs/Self Care Home Management;Cryotherapy;Electrical Stimulation;Moist Heat;Ultrasound;Therapeutic exercise;Manual techniques;Patient/family education;Neuromuscular re-education;Dry needling    PT Next Visit Plan  PATIENT ON HOLD FOR 2 WEEKS  UNTIL 05/06/17. D/C at this time unless MD orders further PT.       Patient will benefit from skilled therapeutic intervention in order to improve the following deficits and impairments:  Pain, Decreased range of motion, Decreased strength, Impaired UE functional use  Visit Diagnosis: Radiculopathy, cervical region     Problem List There are no active problems to display for this patient.  Almyra Free Ward Boissonneault PT 04/22/2017, 3:09 PM  [facility] [address] [place], [place], [address] Phone: [phone]   Fax:  [phone]  Name: Jermaine Spencer MRN: [id number] Date of Birth: [birth year]

## 2017-06-06 ENCOUNTER — Encounter: Payer: Self-pay | Admitting: Family Medicine

## 2017-06-06 ENCOUNTER — Ambulatory Visit (INDEPENDENT_AMBULATORY_CARE_PROVIDER_SITE_OTHER): Payer: Worker's Compensation

## 2017-06-06 ENCOUNTER — Ambulatory Visit (INDEPENDENT_AMBULATORY_CARE_PROVIDER_SITE_OTHER): Payer: Worker's Compensation | Admitting: Family Medicine

## 2017-06-06 VITALS — BP 119/79 | HR 94 | Temp 98.4°F | Ht >= 80 in | Wt 176.0 lb

## 2017-06-06 DIAGNOSIS — M546 Pain in thoracic spine: Secondary | ICD-10-CM

## 2017-06-06 MED ORDER — TIZANIDINE HCL 4 MG PO TABS: 4.0000 mg | ORAL_TABLET | Freq: Four times a day (QID) | ORAL | 0 refills | Status: AC | PRN

## 2017-06-06 MED ORDER — DICLOFENAC SODIUM 75 MG PO TBEC: 75.0000 mg | DELAYED_RELEASE_TABLET | Freq: Two times a day (BID) | ORAL | 0 refills | Status: AC

## 2017-06-06 NOTE — Progress Notes (Signed)
Subjective: CC: Work injury Date of injury: 05/19/17 Employer: [employer] PCP: Joette CatchingNyland, Leonard, MD GNF:[id number]:Jermaine Spencer is a 62 y.o. male presenting to clinic today for:  1. Back pain Patient reports acute onset of right sided mid back pain on 05/19/2017 after he lifted a large object at work.  He notes that he felt a pop in the mid back and immediately had numbness run down his right arm.  He states that he has had difficulty raising his right upper extremity secondary to pain.  He does report chronic issues in the C-spine after an injury at work and plans for corrective surgery of his neck soon.  He does have radiculopathy down the left side from that injury.  Patient reports that he was seen by his primary care doctor for symptoms and was placed on meloxicam and Flexeril.  He reports that symptoms did not improve and earlier this week he was switched over to a prednisone Dosepak.  He reports that he continues to have pain despite medications.  Past medical history is significant for hypertension and hyperlipidemia.    No Known Allergies Past Medical History:  Diagnosis Date  . Chronic neck pain   . Hyperlipidemia   . Hypertension    Past Surgical History:  Procedure Laterality Date  . CERVICAL SPINE SURGERY      Current Outpatient Medications:  .  amLODipine (NORVASC) 10 MG tablet, TAKE ONE TABLET BY MOUTH ONCE DAILY, Disp: , Rfl:  .  cyclobenzaprine (FLEXERIL) 10 MG tablet, , Disp: , Rfl: 0 .  meloxicam (MOBIC) 15 MG tablet, Take 15 mg by mouth daily., Disp: , Rfl:   History reviewed. No pertinent family history. Social History   Socioeconomic History  . Marital status: Married    Spouse name: Not on file  . Number of children: Not on file  . Years of education: Not on file  . Highest education level: Not on file  Occupational History  . Not on file  Social Needs  . Financial resource strain: Not on file  . Food insecurity:    Worry: Not on file   Inability: Not on file  . Transportation needs:    Medical: Not on file    Non-medical: Not on file  Tobacco Use  . Smoking status: Current Every Day Smoker    Packs/day: 0.50    Types: Cigarettes  . Smokeless tobacco: Never Used  Substance and Sexual Activity  . Alcohol use: Never    Frequency: Never  . Drug use: Never  . Sexual activity: Not on file  Lifestyle  . Physical activity:    Days per week: Not on file    Minutes per session: Not on file  . Stress: Not on file  Relationships  . Social connections:    Talks on phone: Not on file    Gets together: Not on file    Attends religious service: Not on file    Active member of club or organization: Not on file    Attends meetings of clubs or organizations: Not on file    Relationship status: Not on file  . Intimate partner violence:    Fear of current or ex partner: Not on file    Emotionally abused: Not on file    Physically abused: Not on file    Forced sexual activity: Not on file  Other Topics Concern  . Not on file  Social History Narrative  . Not on file   ROS: Per HPI  Objective: Office vital signs reviewed. BP 119/79   Pulse 94   Temp 98.4 F (36.9 C) (Oral)   Ht 6\' 9"  (2.057 m)   Wt 176 lb (79.8 kg)   BMI 18.86 kg/m   Physical Examination:  General: Awake, alert, thin male, No acute distress Extremities: warm, well perfused, No edema, cyanosis or clubbing; +2 pulses bilaterally MSK: normal gait and normal station  C-spine: Patient with limited active range of motion in flexion, extension and rotation of the C-spine.  He has no midline tenderness to palpation but does have paraspinal tenderness to palpation bilaterally.  Thoracic spine: Patient with limited active range of motion in flexion and extension.  No midline tenderness to palpation.  He does have quite a bit of paraspinal muscle tenderness palpation, particularly on the right side.  There is increased tonicity and ropiness of the paraspinal  muscles along the right side of the thoracic's as well.  No scapular winging.  Rib cage: No palpable deformities but patient does have pain, particularly along the posterior rib cage.  Right upper extremity: Patient has limited active range of motion in abduction and flexion, with about a 90 degree loss in both planes.  He has limited internal rotation and external rotation secondary to pain.  No tenderness to palpation to the shoulder.  No appreciable deformities.   Skin: dry; intact; no rashes or lesions Neuro: 5/5 Upper extremity Strength and light touch sensation grossly intact  No results found.  Assessment/ Plan: 62 y.o. male   1. Acute right-sided thoracic back pain X-rays of the thoracic's were obtained but there were no gross bony deformities appreciated.  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  His physical exam is difficult to discern given his thoracic back pain but radicular symptoms down the right upper extremity.  I do question whether or not he may have C-spine involvement versus something going on within the rotator cuff.  I will refer to orthopedics for further evaluation given this patient's complex C-spine history.  I have discontinued his meloxicam and Flexeril.  He may start Voltaren and Zanaflex and attempt to give him some relief.  Instructions reviewed with the patient.  Home care instructions discussed.  Handout provided.  For now, out of work until he is evaluated.  Further restrictions per orthopedics. - DG Thoracic Spine 2 View; Future - Ambulatory referral to Orthopedic Surgery   Orders Placed This Encounter  Procedures  . DG Thoracic Spine 2 View    Standing Status:   Future    Number of Occurrences:   1    Standing Expiration Date:   08/06/2018    Order Specific Question:   Reason for Exam (SYMPTOM  OR DIAGNOSIS REQUIRED)    Answer:   right sided thoracic back pain    Order Specific Question:   Preferred imaging location?    Answer:    Internal  . Ambulatory referral to Orthopedic Surgery    Referral Priority:   Routine    Referral Type:   Surgical    Referral Reason:   Specialty Services Required    Requested Specialty:   Orthopedic Surgery    Number of Visits Requested:   1   Meds ordered this encounter  Medications  . diclofenac (VOLTAREN) 75 MG EC tablet    Sig: Take 1 tablet (75 mg total) by mouth 2 (two) times daily.    Dispense:  30 tablet    Refill:  0  . tiZANidine (  ZANAFLEX) 4 MG tablet    Sig: Take 1 tablet (4 mg total) by mouth every 6 (six) hours as needed for muscle spasms.    Dispense:  30 tablet    Refill:  0     Ashly Hulen Skains, DO [facility] [phone]

## 2017-06-06 NOTE — Patient Instructions (Signed)
Stop the cyclobenzaprine and meloxicam.  I have replaced these with diclofenac and tizanidine.  I have placed a referral to the orthopedist.  You had back x-rays done today in anticipation for this appointment to evaluate for any possible fractures.  You have prescribed a nonsteroidal anti-inflammatory drug (NSAID) today. This will help with ear pain and inflammation. Please do not take any other NSAIDs (ibuprofen/Motrin/Advil, naproxen/Aleve, meloxicam/Mobic, Voltaren/diclofenac). Please make sure to eat a meal when taking this medication.   Caution:  If you have a history of acid reflux/indigestion, I recommend that you take an antacid (such as Prilosec, Prevacid) daily while on the NSAID.  If you have a history of bleeding disorder, gastric ulcer, are on a blood thinner (like warfarin/Coumadin, Xarelto, Eliquis, etc) please do not take NSAID.  If you have ever had a heart attack, you should not take NSAIDs.   Thoracic Strain A thoracic strain, which is sometimes called a mid-back strain, is an injury to the muscles or tendons that attach to the upper part of your back behind your chest. This type of injury occurs when a muscle is overstretched or overloaded. Thoracic strains can range from mild to severe. Mild strains may involve stretching a muscle or tendon without tearing it. These injuries may heal in 1-2 weeks. More severe strains involve tearing of muscle fibers or tendons. These will cause more pain and may take 6-8 weeks to heal. What are the causes? This condition may be caused by:  An injury in which a sudden force is placed on the muscle.  Exercising without properly warming up.  Overuse of the muscle.  Improper form during certain movements.  Other injuries that surround or cause stress on the mid-back, causing a strain on the muscles.  In some cases, the cause may not be known. What increases the risk? This injury is more common in:  Athletes.  People with  obesity.  What are the signs or symptoms? The main symptom of this condition is pain, especially with movement. Other symptoms include:  Bruising.  Swelling.  Spasm.  How is this diagnosed? This condition may be diagnosed with a physical exam. X-rays may be taken to check for a fracture. How is this treated? This condition may be treated with:  Resting and icing the injured area.  Physical therapy. This will involve doing stretching and strengthening exercises.  Medicines for pain and inflammation.  Follow these instructions at home:  Rest as needed. Follow instructions from your health care provider about any restrictions on activity.  If directed, apply ice to the injured area: ? Put ice in a plastic bag. ? Place a towel between your skin and the bag. ? Leave the ice on for 20 minutes, 2-3 times per day.  Take over-the-counter and prescription medicines only as told by your health care provider.  Begin doing exercises as told by your health care provider or physical therapist.  Always warm up properly before physical activity or sports.  Bend your knees before you lift heavy objects.  Keep all follow-up visits as told by your health care provider. This is important. Contact a health care provider if:  Your pain is not helped by medicine.  Your pain, bruising, or swelling is getting worse.  You have a fever. Get help right away if:  You have shortness of breath.  You have chest pain.  You develop numbness or weakness in your legs.  You have involuntary loss of urine (urinary incontinence). This information is not  intended to replace advice given to you by your health care provider. Make sure you discuss any questions you have with your health care provider. Document Released: 03/16/2003 Document Revised: 08/26/2015 Document Reviewed: 02/17/2014 Elsevier Interactive Patient Education  [employer].

## 2021-11-09 ENCOUNTER — Ambulatory Visit (INDEPENDENT_AMBULATORY_CARE_PROVIDER_SITE_OTHER): Payer: Medicare (Managed Care)

## 2021-11-09 ENCOUNTER — Encounter: Payer: Self-pay | Admitting: Orthopedic Surgery

## 2021-11-09 ENCOUNTER — Ambulatory Visit (INDEPENDENT_AMBULATORY_CARE_PROVIDER_SITE_OTHER): Payer: Medicare (Managed Care) | Admitting: Orthopedic Surgery

## 2021-11-09 VITALS — BP 129/78 | HR 78 | Ht >= 80 in | Wt 194.8 lb

## 2021-11-09 DIAGNOSIS — M5416 Radiculopathy, lumbar region: Secondary | ICD-10-CM

## 2021-11-09 DIAGNOSIS — M545 Low back pain, unspecified: Secondary | ICD-10-CM | POA: Diagnosis not present

## 2021-11-09 NOTE — Progress Notes (Signed)
Orthopedic Spine Surgery Office Note  Assessment: Patient is a 66 y.o. male with low back pain that radiates into his bilateral legs.  Mostly goes into the posterior thighs and on the left side goes down posterior lateral aspect in an L5 distribution.   Plan: -Explained that initially conservative treatment is tried as a significant number of patients may experience relief with these treatment modalities. Discussed that the conservative treatments include:  -activity modification  -physical therapy  -over the counter pain medications  -medrol dosepak  -lumbar steroid injections -Patient has regularly been using NSAIDs, topical creams, activity modification.  Has tried hydrocodone and a steroid injection at his PCPs office as well -Recommended MRI of the lumbar spine to evaluate for radiculopathy -Patient should return to office in 3 weeks, repeat x-rays of lumbar spine at next visit: None   Patient expressed understanding of the plan and all questions were answered to the patient's satisfaction.   ___________________________________________________________________________   History:  Patient is a 66 y.o. male who presents today for lumbar spine.  Patient has a history of a lumbar microdiscectomy which he recovered well from many years ago.  He did not have any low back or leg pain until July of this year.  Now he feels pain in his bilateral posterior hips that radiates down the back of the thighs.  It sometimes alternates between the right and left leg.  He feels it on a daily basis.  His left leg is more symptomatic than his right.  On the left side, the pain sometimes radiates down the posterior lateral aspect of the leg into the dorsal lateral aspect of the foot.  There is no trauma or injury that brought on the pain.  He feels the pain both with activity and at rest.  Has not noticed anything that makes it better or worse in terms of activity or position. Pain has been getting  progressively worse.    Weakness: Yes, his legs feel weaker.  No other weakness Symptoms of imbalance: Yes, since his legs feel weak he has felt more off balance Paresthesias and numbness: Denies Bowel or bladder incontinence: Denies Saddle anesthesia: Denies  Treatments tried: Hydrocodone, NSAIDs, activity modification, steroid injection, topical cream  Review of systems: Denies fevers and chills, night sweats, unexplained weight loss, history of cancer.  Pain has awakened him at night in the past couple months  Past medical history: Hyperlipidemia Hypertension Rheumatoid arthritis Chronic pain  Allergies: Seasonal  Past surgical history:  Cervical spine surgery  Social history: Reports use of nicotine product (smoking, vaping, patches, smokeless) -1 pack/week Alcohol use: Denies Denies recreational drug use   Physical Exam:  General: no acute distress, appears stated age Neurologic: alert, answering questions appropriately, following commands Respiratory: unlabored breathing on room air, symmetric chest rise Psychiatric: appropriate affect, normal cadence to speech   MSK (spine):  -Strength exam      Left  Right EHL    4/5  4/5 TA    5/5  5/5 GSC    5/5  5/5 Knee extension  5/5  5/5 Hip flexion   5/5  5/5  -Sensory exam    Sensation intact to light touch in L3-S1 nerve distributions of bilateral lower extremities  -Achilles DTR: 1/4 on the left, 1/4 on the right -Patellar tendon DTR: 1/4 on the left, 1/4 on the right  -Straight leg raise: Positive on the left -Contralateral straight leg raise: Negative -Clonus: no beats bilaterally -Negative Hoffmann bilaterally  -Left hip exam:  No pain through range of motion, negative Stinchfield -Right hip exam: No pain through range of motion, negative Stinchfield  Imaging: XR of the lumbar spine from 11/09/2021 was independently reviewed and interpreted, showing no fracture or dislocation. No evidence of  instability on flexion/extension. Disc height loss with anterior osteophyte formation at T11/12. PI of 49, LL of 51.    Patient name: Jermaine Spencer Patient MRN: [id number] Date of visit: 11/09/21

## 2021-12-02 ENCOUNTER — Inpatient Hospital Stay: Admission: RE | Admit: 2021-12-02 | Payer: Worker's Compensation | Source: Ambulatory Visit

## 2021-12-18 ENCOUNTER — Ambulatory Visit
Admission: RE | Admit: 2021-12-18 | Discharge: 2021-12-18 | Disposition: A | Discharge status: Home / Self Care | Payer: Worker's Compensation | Source: Ambulatory Visit | Attending: Orthopedic Surgery | Admitting: Orthopedic Surgery

## 2021-12-18 DIAGNOSIS — M5416 Radiculopathy, lumbar region: Secondary | ICD-10-CM
